# Patient Record
Sex: Female | Born: 1935 | Race: White | Hispanic: No
Health system: Southern US, Community
[De-identification: ages and names within clinical notes are randomized; demographics above are authoritative.]

---

## 2000-12-23 ENCOUNTER — Other Ambulatory Visit: Admission: RE | Admit: 2000-12-23 | Discharge: 2000-12-23 | Payer: Self-pay | Admitting: Internal Medicine

## 2001-02-21 ENCOUNTER — Emergency Department (HOSPITAL_COMMUNITY): Admission: EM | Admit: 2001-02-21 | Discharge: 2001-02-21 | Payer: Self-pay | Admitting: Emergency Medicine

## 2001-02-23 ENCOUNTER — Encounter: Payer: Self-pay | Admitting: Internal Medicine

## 2001-02-23 ENCOUNTER — Encounter: Admission: RE | Admit: 2001-02-23 | Discharge: 2001-02-23 | Payer: Self-pay | Admitting: Internal Medicine

## 2001-03-15 ENCOUNTER — Encounter: Admission: RE | Admit: 2001-03-15 | Discharge: 2001-03-15 | Payer: Self-pay | Admitting: Neurosurgery

## 2001-03-15 ENCOUNTER — Encounter: Payer: Self-pay | Admitting: Neurosurgery

## 2001-04-04 ENCOUNTER — Encounter: Payer: Self-pay | Admitting: Neurosurgery

## 2001-04-04 ENCOUNTER — Encounter: Admission: RE | Admit: 2001-04-04 | Discharge: 2001-04-04 | Payer: Self-pay | Admitting: Neurosurgery

## 2001-04-18 ENCOUNTER — Encounter: Payer: Self-pay | Admitting: Neurosurgery

## 2001-04-18 ENCOUNTER — Encounter: Admission: RE | Admit: 2001-04-18 | Discharge: 2001-04-18 | Payer: Self-pay | Admitting: Neurosurgery

## 2002-08-29 ENCOUNTER — Other Ambulatory Visit: Admission: RE | Admit: 2002-08-29 | Discharge: 2002-08-29 | Payer: Self-pay | Admitting: Internal Medicine

## 2006-10-19 ENCOUNTER — Ambulatory Visit: Payer: Self-pay | Admitting: Vascular Surgery

## 2007-03-11 ENCOUNTER — Encounter: Admission: RE | Admit: 2007-03-11 | Discharge: 2007-03-11 | Payer: Self-pay | Admitting: Internal Medicine

## 2007-06-23 ENCOUNTER — Encounter: Admission: RE | Admit: 2007-06-23 | Discharge: 2007-06-23 | Payer: Self-pay | Admitting: Internal Medicine

## 2007-09-29 ENCOUNTER — Encounter: Admission: RE | Admit: 2007-09-29 | Discharge: 2007-09-29 | Payer: Self-pay | Admitting: Internal Medicine

## 2007-10-26 ENCOUNTER — Encounter: Admission: RE | Admit: 2007-10-26 | Discharge: 2007-10-26 | Payer: Self-pay | Admitting: Specialist

## 2007-11-14 ENCOUNTER — Encounter: Admission: RE | Admit: 2007-11-14 | Discharge: 2007-11-14 | Payer: Self-pay | Admitting: Specialist

## 2008-01-11 ENCOUNTER — Inpatient Hospital Stay (HOSPITAL_COMMUNITY): Admission: RE | Admit: 2008-01-11 | Discharge: 2008-01-14 | Payer: Self-pay | Admitting: Specialist

## 2008-04-27 ENCOUNTER — Encounter: Admission: RE | Admit: 2008-04-27 | Discharge: 2008-04-27 | Payer: Self-pay | Admitting: Internal Medicine

## 2009-07-24 ENCOUNTER — Encounter: Admission: RE | Admit: 2009-07-24 | Discharge: 2009-07-24 | Payer: Self-pay | Admitting: Specialist

## 2009-09-18 IMAGING — CR DG LUMBAR SPINE 2-3V
3 series · 3 of 3 positions shown · non-contrast
Comparison: MRI dated 09/29/2007

CLINICAL DATA: Preop examination for lumbar stenosis.

LUMBAR SPINE - 2-3 VIEW

[t l-spine a.p.]
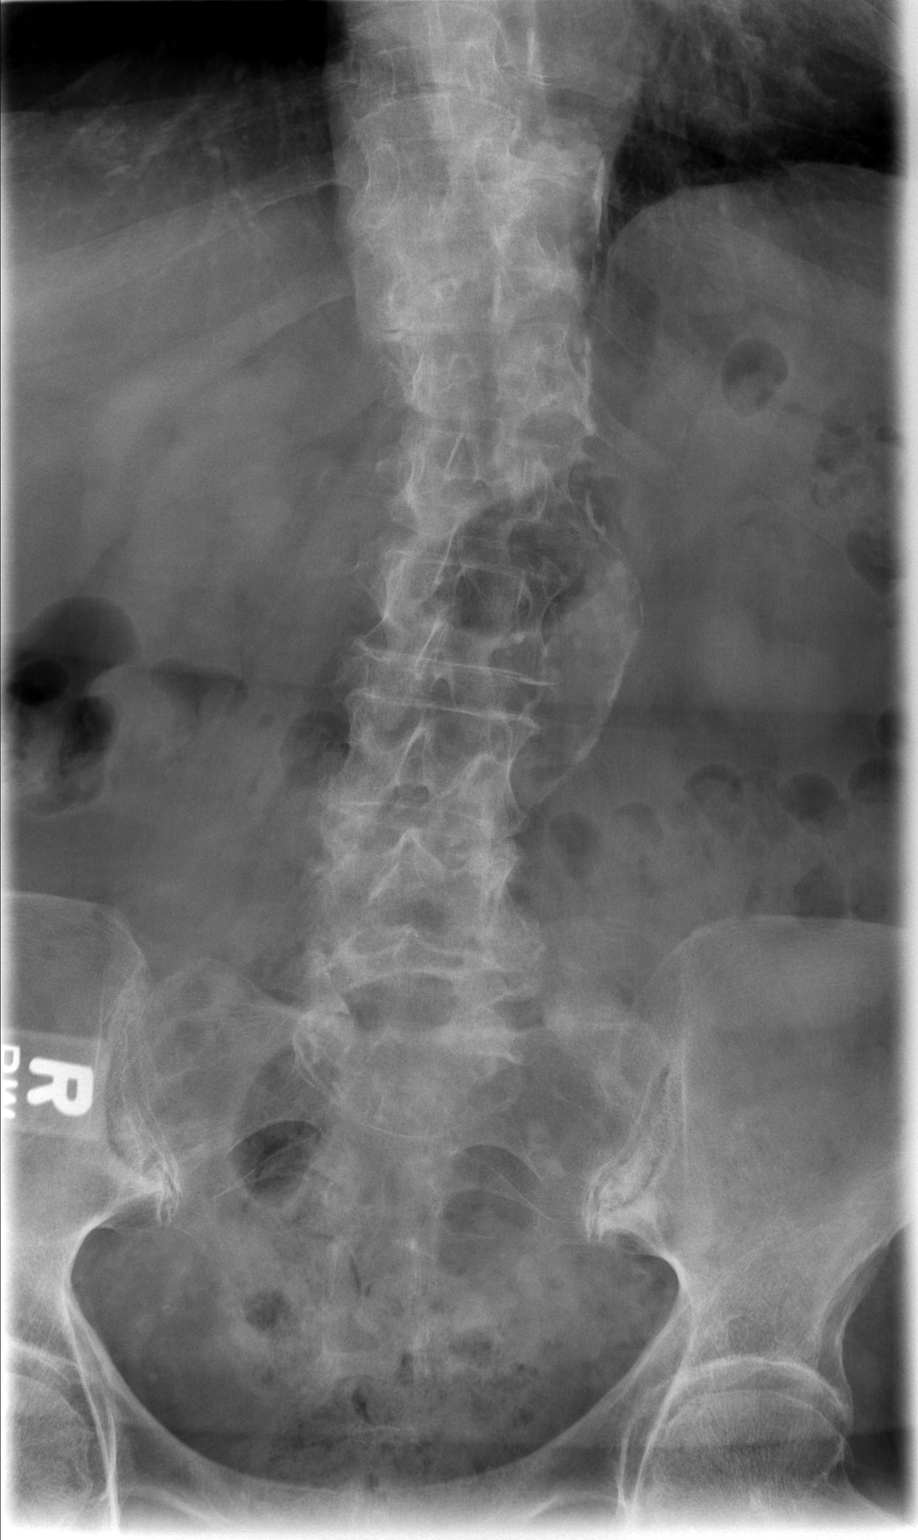

[t l-spine lat]
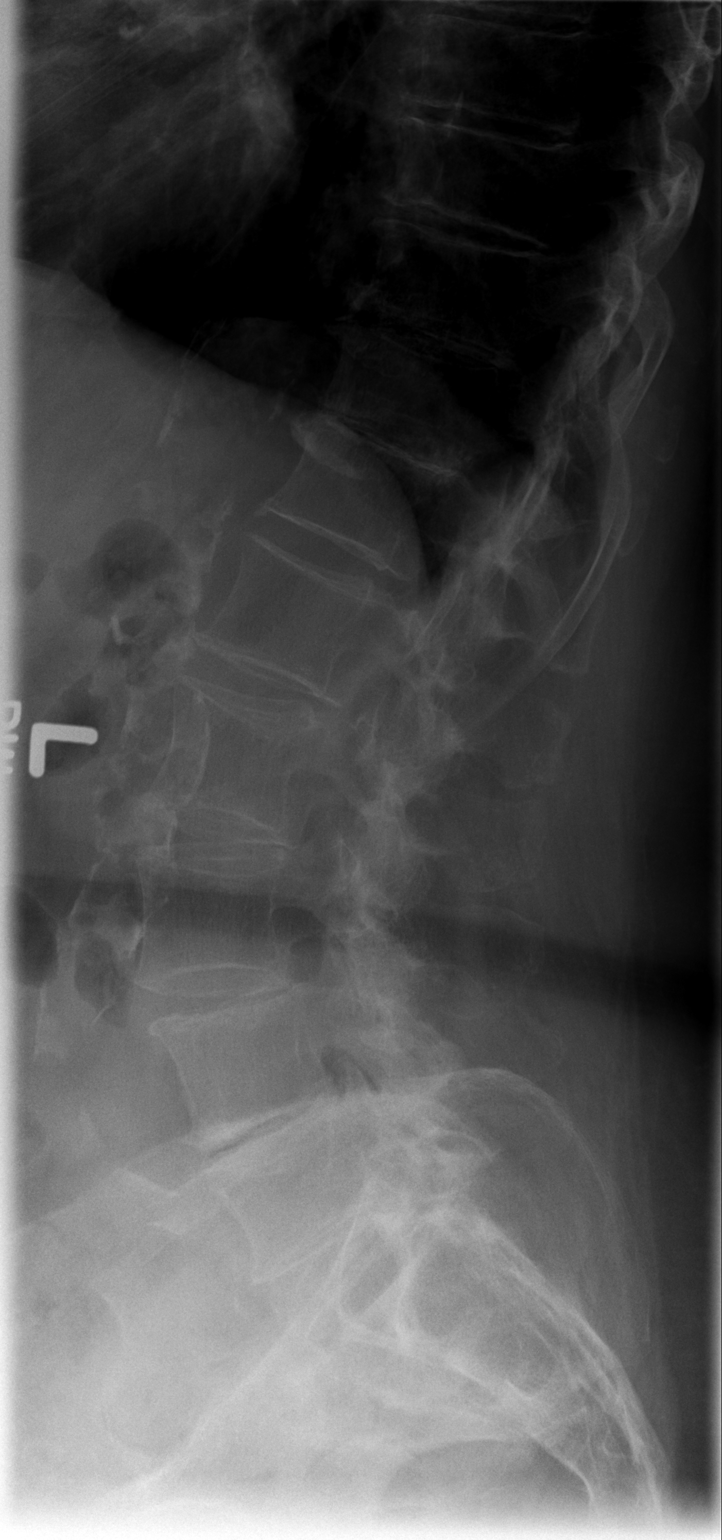

[t l-spine l5-s1 spot]
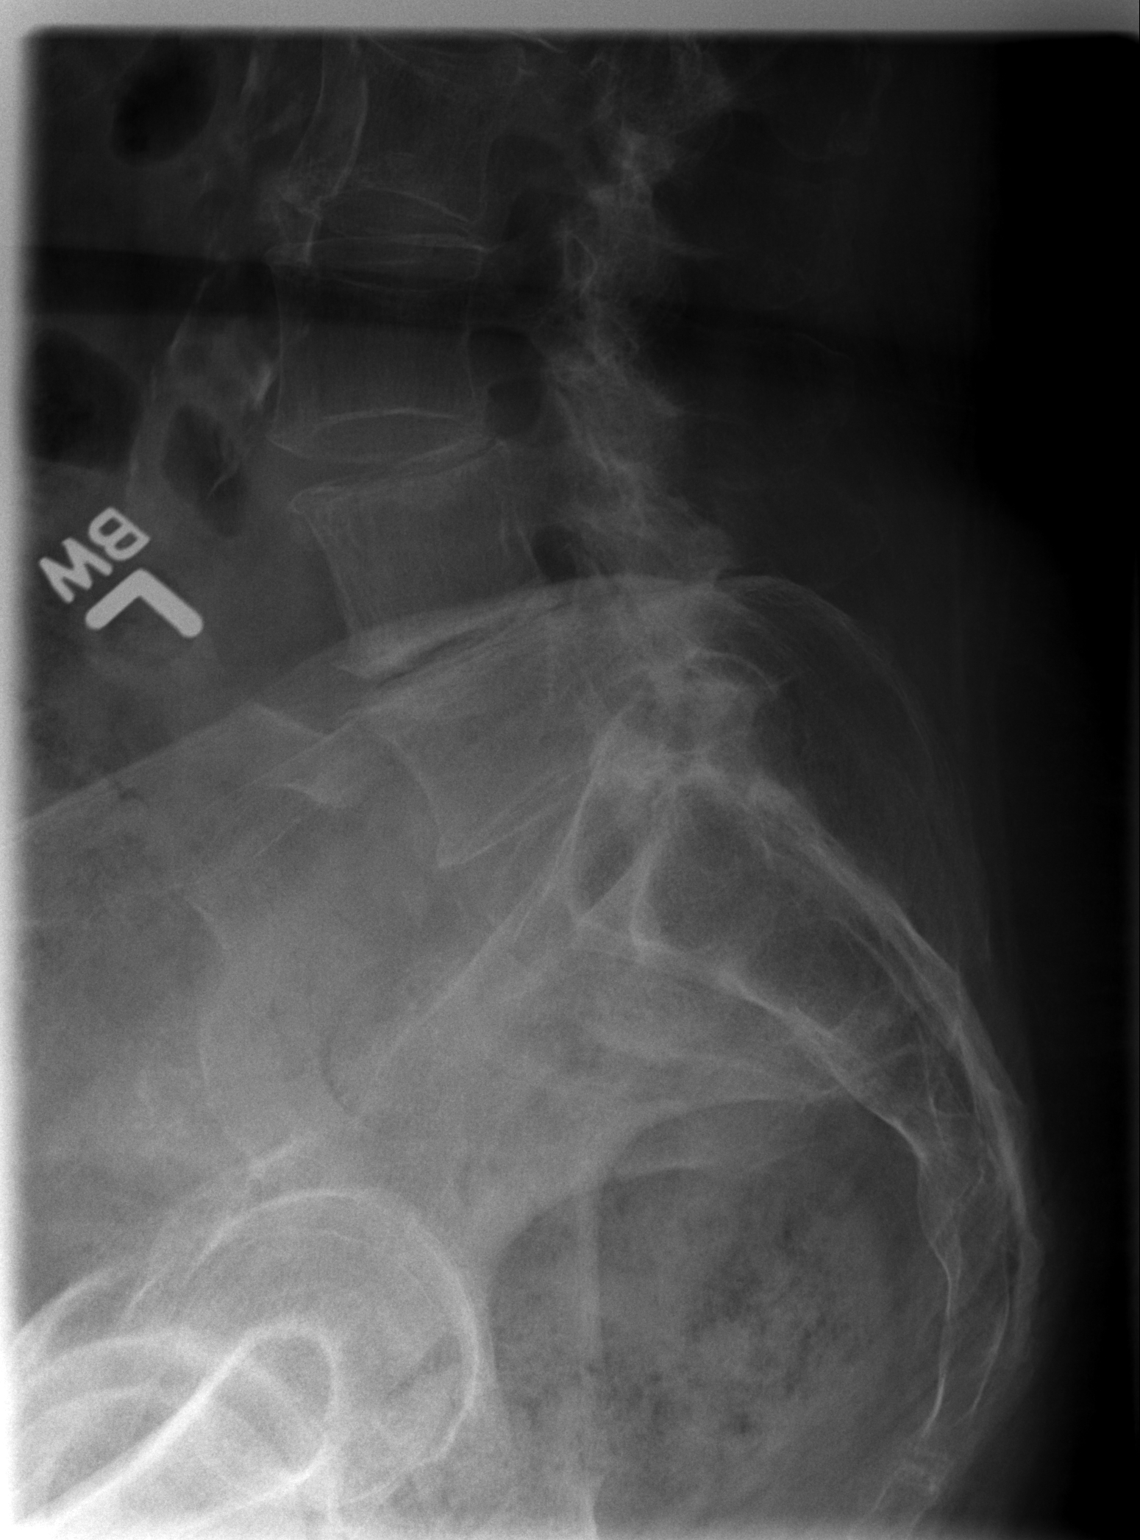

[3 of 3 positions shown; findings below may reference images not displayed]

FINDINGS: Five non-rib bearing lumbar type vertebra are present
with stable 3 mm anterolisthesis of L3 on L4.
Mild to moderate multilevel degenerative disc disease and facet
arthropathy noted throughout the lumbar spine with severe
degenerative disc disease and spondylosis at L4-L5.
Diffuse osteopenia is noted.
Aortic atherosclerotic calcifications are identified.
A mild apex left thoracolumbar scoliosis is present.
IMPRESSION: No evidence of acute abnormality.

Stable multilevel degenerative disc disease, spondylosis and facet
arthropathy, greatest at L4-L5.

Stable grade 1 anterolisthesis of L3 on L4 and diffuse osteopenia.

## 2009-09-20 IMAGING — CR DG OR PORTABLE SPINE
1 series · 1 of 1 positions shown · non-contrast
Comparison: Intraoperative views done earlier the same date and
preoperative radiographs 01/09/2008.

CLINICAL DATA: L3-L4 spinal stenosis.

PORTABLE SPINE

[view not recorded]
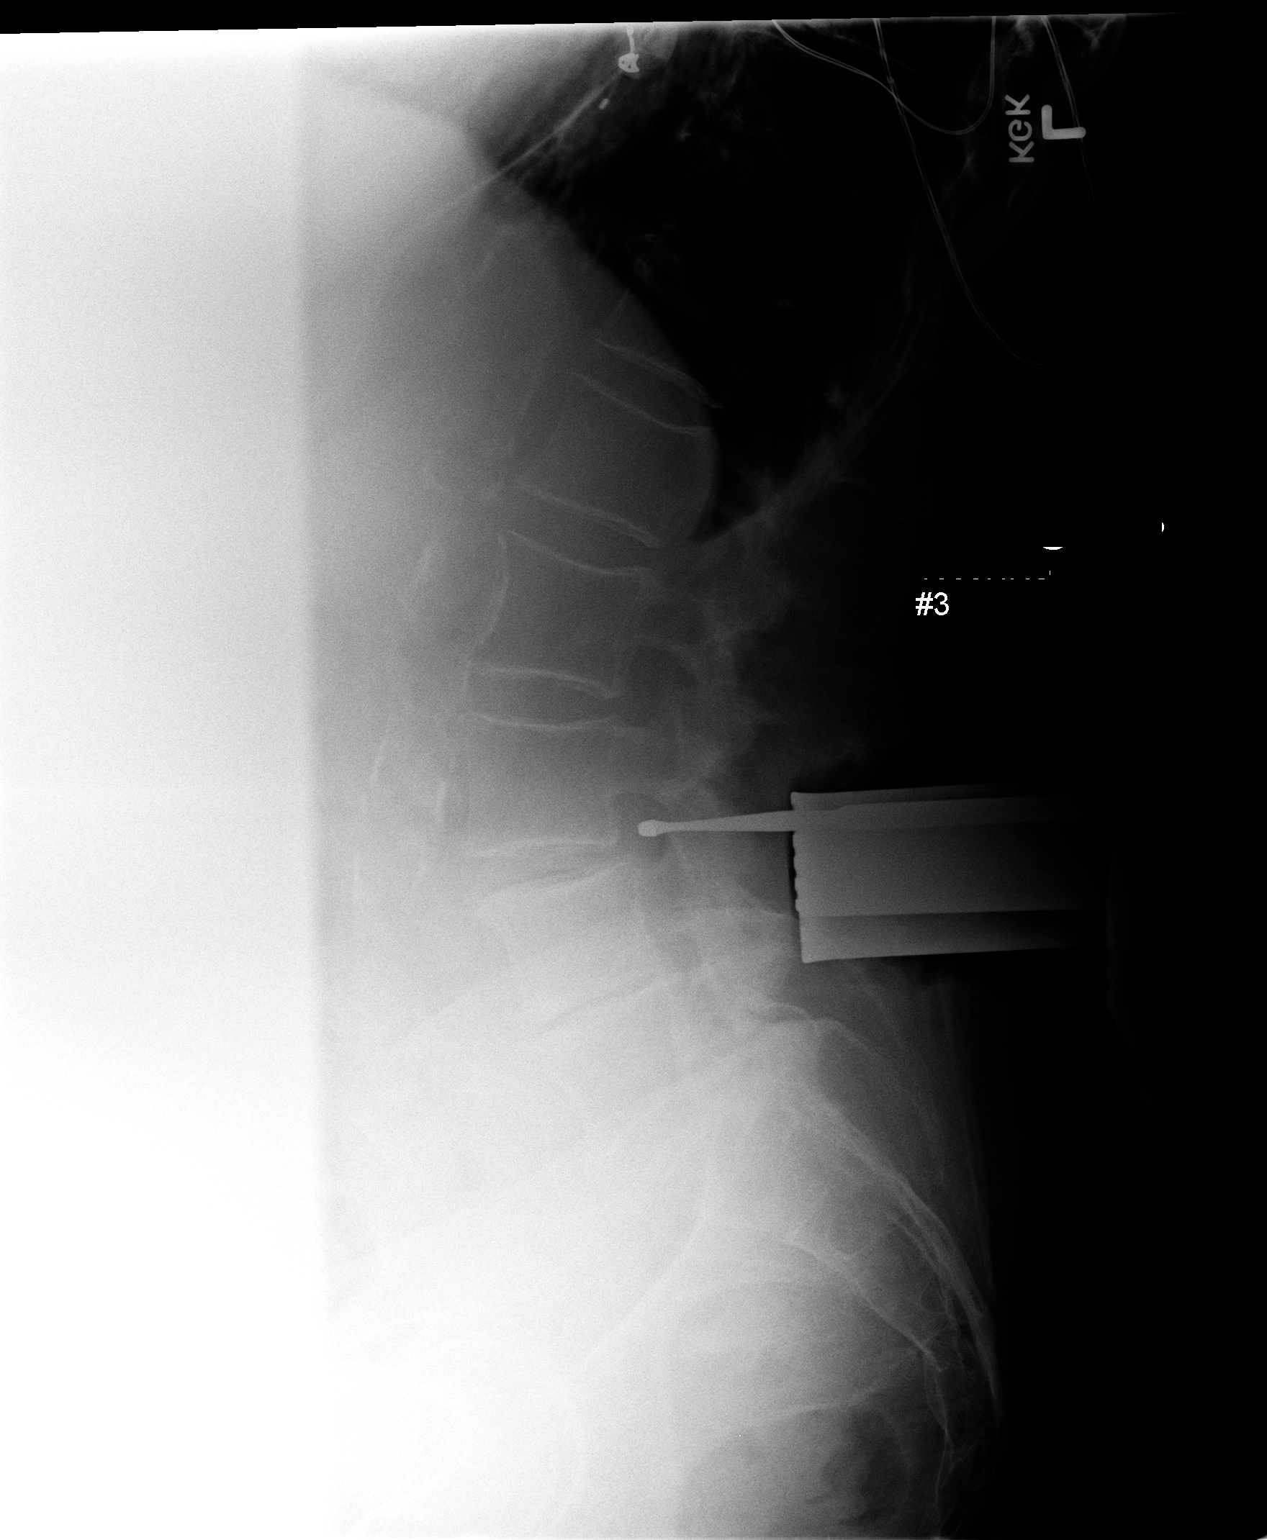

[1 of 1 positions shown; findings below may reference images not displayed]

FINDINGS: Film #3 at 4428 hours demonstrates skin spreaders
posterior to the L4 vertebral body.  There is a blunt surgical
instrument directed towards the posterior aspect of the L3-L4 disc
space.
IMPRESSION: L3-L4 localization.

## 2010-04-17 ENCOUNTER — Other Ambulatory Visit: Payer: Self-pay | Admitting: Internal Medicine

## 2010-04-17 DIAGNOSIS — R05 Cough: Secondary | ICD-10-CM

## 2010-04-21 ENCOUNTER — Ambulatory Visit
Admission: RE | Admit: 2010-04-21 | Discharge: 2010-04-21 | Disposition: A | Discharge status: Home / Self Care | Payer: Medicare Other | Source: Ambulatory Visit | Attending: Internal Medicine | Admitting: Internal Medicine

## 2010-04-21 DIAGNOSIS — R05 Cough: Secondary | ICD-10-CM

## 2010-04-23 ENCOUNTER — Ambulatory Visit (HOSPITAL_COMMUNITY)
Admission: RE | Admit: 2010-04-23 | Discharge: 2010-04-23 | Disposition: A | Discharge status: Home / Self Care | Payer: Medicare Other | Source: Ambulatory Visit | Attending: Internal Medicine | Admitting: Internal Medicine

## 2010-04-23 DIAGNOSIS — R0609 Other forms of dyspnea: Secondary | ICD-10-CM | POA: Insufficient documentation

## 2010-04-23 DIAGNOSIS — R0989 Other specified symptoms and signs involving the circulatory and respiratory systems: Secondary | ICD-10-CM | POA: Insufficient documentation

## 2010-06-24 NOTE — Procedures (Signed)
LOWER EXTREMITY ARTERIAL EVALUATION-EXERCISE WORKSHEET   INDICATION:  Bilateral claudication.  Patient complains of bilateral  lower extremity leg tiredness with exercise.   HISTORY:  Diabetes:  Orally controlled  Cardiac:  No  Hypertension:  Yes  Smoking:  No  Previous Surgery:  No   RESTING SYSTOLIC PRESSURES: (ABI)                                RIGHT       LEFT  Brachial:                     156         149  Thigh:  Anterior tibial at ankle:     158         158  Posterior tibial at ankle:    164 (>1.0)  116 (>1.0)  Peroneal at ankle:  DOPPLER WAVEFORM ANALYSIS:  Common femoral:  Popliteal:  Anterior tibial:              Triphasic   Triphasic  Posterior tibial:             Triphasic   Triphasic  Peroneal:   EXERCISE  Walking Time:  Five minutes.  Speed:  Incline:  Ambulated In Hallway:  Yes   SYMPTOMS:  Right hip pain after five minutes of exercise.  IMPRESSION:  1.  Resting ankle brachial indices and waveform suggest no  significant arterial occlusion.  2.  Ankle pressures were stable following exercise, suggestive of no  arterial occlusive disease bilaterally.   POST-EXERCISE  RIGHT                      LEFT  164        immediately     158             2 min.             4 min.             6 min.             8 min.             10 min.   ___________________________________________  Quita Skye. Hart [place], M.D.   MC/MEDQ  D:  10/19/2006  T:  10/20/2006  Job:  [id number]

## 2010-06-24 NOTE — Consult Note (Signed)
VASCULAR SURGERY CONSULTATION   SANTIAGO, GRAF  DOB:  1925-11-25                                       10/19/2006  ZOXWR#:[id number]   REFERRING PHYSICIAN:  Massie Maroon, MD   HISTORY OF PRESENT ILLNESS:  Ms. Weesner is a 77 year old female referred  by Dr. Selena Batten with a history of weakness in both anterior thighs with  ambulation.  She states that each day she walks about 1 mile but after  1/2 mile she gets heaviness in both thighs in the anterior aspect, the  right equal to the left.  She does not develop numbness, calf discomfort  or pain below the knees, and eventually this resolves when she stops  walking (5 minutes).  She has no history of rest pain or nonhealing  ulcers.   PAST MEDICAL HISTORY:  1. Diabetes mellitus, noninsulin dependent.  2. Hypertension.  3. Negative coronary artery disease, COPD, stroke or hyperlipidemia.   PAST SURGICAL HISTORY:  1. Appendectomy.  2. D&C.   FAMILY HISTORY:  Positive for coronary artery disease in her mother.  Negative for diabetes and stroke.   SOCIAL HISTORY:  She is widowed, has 4 children, and is retired.  She  does not use tobacco or alcohol.   REVIEW OF SYSTEMS:  Please see health history form.   MEDICATIONS:  Please see health history form.   ALLERGIES:  CODEINE CAUSES NAUSEA.   PHYSICAL EXAMINATION:  VITAL SIGNS:  Blood pressure is 140/70, heart  rate 70, respirations 18.  GENERAL:  She is a healthy-appearing, elderly female in no apparent  distress, alert and oriented times 3.  NECK:  Supple, 3+ carotid pulses palpable.  No bruits are audible.  There is no palpable adenopathy in the neck.  NEUROLOGIC:  Exam is normal.  CHEST:  Clear to auscultation.  CARDIOVASCULAR:  Exam reveals a regular rhythm with no murmurs.  ABDOMEN:  Soft, nontender, with no palpable masses.  She has 3+ femoral,  popliteal, and 2+ posterior tibial pulses bilaterally, with well-  perfused lower extremities.  There is no distal  edema.   ASSESSMENT:  Lower extremity arterial Dopplers were performed and  pressures were normal at rest and after 5 minutes of exercise.  Her  study was totally normal, with no evidence of any arterial insufficiency  to account for her symptoms.  I have reassured her regarding this, and  she does not need any further vascular evaluation.  I encouraged her to  continue to exercise actively.   Quita Skye Hart [place], M.D.  Electronically Signed  JDL/MEDQ  D:  10/19/2006  T:  10/20/2006  Job:  [id number]   cc:   Massie Maroon, MD

## 2010-06-24 NOTE — H&P (Signed)
Suzanne Thomas, BACOT NO.:  [id number]   MEDICAL RECORD NO.:  [id number]          PATIENT TYPE:  INP   LOCATION:                               FACILITY:  [facility]   PHYSICIAN:  Jene Every, M.D.    DATE OF BIRTH:  12/18/1925   DATE OF ADMISSION:  01/11/2008  DATE OF DISCHARGE:                              HISTORY & PHYSICAL   CHIEF COMPLAINT:  Left lower extremity pain.   HISTORY:  The patient is a pleasant 77 year old female who first  presented to our office in September of this year as a referral from her  primary care physician with symptoms of neurogenic claudication.  She  initially underwent conservative treatment including epidural steroid  series, but only had short-term relief from this.  She describes her  pain pattern as being disabling.  MRI studies do show severe stenosis at  3-4.  It is felt at this time she would benefit from a lumbar  decompression.  The risks and benefits of this were discussed with the  patient.  Medical clearance was obtained.  The patient was scheduled.   MEDICAL HISTORY:  Significant for:   1. Hypertension.  2. Hypercholesteremia.  3. Non-insulin-dependent diabetes.   CURRENT MEDICATIONS:  1. Actos 15 mg 1 p.o. b.i.d.  2. HCTZ 12.5 mg 1 p.o. every other day.  3. Lipitor 40 mg 1 p.o. q.h.s.  4. Byetta 5 mcg 1 p.o. b.i.d.  5. Azor 10/40 one p.o. daily.  6. Tekturna 300 mg 1 p.o. daily.  7. Aspirin 81 mg daily.  8. Ibuprofen 2 tablets t.i.d.   ALLERGIES:  The patient does have sensitivity to most PAIN MEDICATIONS.  CODEINE causes nausea.  __________ .  However, the patient is unaware  __________ to this.   PREVIOUS SURGERIES:  1. Tubal dictation.  2. Appendectomy.   SOCIAL HISTORY:  The patient is widowed.  She is retired.  She has no  history of tobacco or alcohol consumption.   PRIMARY CARE PHYSICIAN:  Dr. Pearson Grippe.   FAMILY HISTORY:  Her mother deceased of renal disease.  Father of  coronary artery  disease.   REVIEW OF SYSTEMS:  GENERAL:  The patient denies any fever, chills,  night sweats or bleeding tendencies.  CNS:  No blurred double vision, seizure, headache or paralysis.  RESPIRATORY:  No shortness of breath, productive cough or hemoptysis.  CARDIOVASCULAR:  No chest pain, angina or orthopnea.  GU:  No dysuria, hematuria or discharge.  GI:  No nausea, vomiting, diarrhea, constipation, melena.  MUSCULOSKELETAL:  As pertinent in HPI.   PHYSICAL EXAMINATION:  Weight is 202, BP is 140/78, respiratory 10,  pulse is 76.  GENERAL:  This is an overweight female who does walk with an antalgic  gait.  HEENT:  Atraumatic, normocephalic.  Pupils equal, round, and  reactive to light.  EOMs intact.  NECK:  Supple.  No lymphadenopathy.  CHEST:  Clear to auscultation bilaterally.  No rhonchi, wheezes or  rales.  BREASTS/GU:  Not examined and not pertinent to the HPI.  HEART:  Regular rate and  rhythm without murmurs, gallops or rubs.  ABDOMEN:  Protuberant, soft, nontender.  Bowel sounds x4.  SKIN:  No rashes or lesions are noted.  In regard to the back the patient does note some __________ flexion and  extension.  Motor is 5/5.  She is normoflexive.  Neurovascular function  is intact bilaterally.  She does have positive straight leg raise on the  left that produces  some buttock and low back pain.   IMPRESSION:  Severe stenosis at 3-4.   PLAN:  The patient admitted to [facility] to undergo a lumbar  decompression at 3-4, possible decompression 2-3 and 4-5 with lateral  mass fusion at 3-4.      Roma Schanz, P.A.      Jene Every, M.D.  Electronically Signed    CS/MEDQ  D:  01/03/2008  T:  01/03/2008  Job:  [id number]

## 2010-06-24 NOTE — Op Note (Signed)
Suzanne Thomas, Suzanne Thomas NO.:  [id number]   MEDICAL RECORD NO.:  [id number]          PATIENT TYPE:  INP   LOCATION:  0003                         FACILITY:  [facility]   PHYSICIAN:  Jene Every, M.D.    DATE OF BIRTH:  1925/10/11   DATE OF PROCEDURE:  01/11/2008  DATE OF DISCHARGE:                               OPERATIVE REPORT   PREOPERATIVE DIAGNOSIS:  Spinal stenosis at L3-4.   POSTOPERATIVE DIAGNOSIS:  Spinal stenosis at L3-4, stenosis at L2-3.   PROCEDURE:  Lumbar decompression L3-4, L2-3, central laminectomy of L3,  bilateral hemilaminotomy, lateral recess decompression at 3-4,  foraminotomy of 4 and 3 with microdiskectomy 3-4 left.   ANESTHESIA:  General.   SURGEON:  Jene Every, M.D.   ASSISTANT:  Roma Schanz, P.A.   BRIEF HISTORY:  An 77 year old with left lower extremity radiculopathy  and stenosis laterally at 3-4 and neural foraminal narrowing secondary  to disk herniation, neural foraminal stenosis, facet hypertrophy,  indication for decompression of the L4 and L3 nerve root, possible  fusion due to listhesis.  Risks and benefits were discussed including  bleeding, infection, damage to neurovascular structures, CSF leakage,  epidural fibrosis, anterior segment disease, need for fusion in the  future, anesthetic complication, etc.   PROCEDURE IN DETAIL:  With the patient in supine position after an  adequate level of general anesthesia and 2 grams of Kefzol and Foley she  was placed prone on the Hamilton frame.  All bony prominences were well-  padded.  Lumbar region was prepped and draped in the usual sterile  fashion.  Two 18 gauge spinal needles were utilized to localize the 3-4  interspace confirmed with x-ray.  Incision was made from above the  spinous process of 3 to below the spinous process of 4.  Subcutaneous  tissue was dissected.  Electrocautery was utilized to achieve  hemostasis.  Dorsolumbar fascia identified and divided in line  with skin  incision.  Paraspinous muscle elevated from the lamina of 2, 3 and 4.  McCullough retractor was placed.  Confirmatory radiographs obtained.  The spinous processes of 3, partial 4 were removed with the Leksell  rongeurs.  We saved the bone for possible fusion.  The ligamentum flavum  hypertrophy and a small interlaminar window was noted at 3-4.  We used a  2 mm Kerrison to perform a central decompression and remove the central  lamina of 3.  Severe lateral recess stenosis.  With the use of the  operating microscope which was draped and brought into the surgical  field to decompressed the lateral recess to the medial border of the  pedicle.  Performed a foraminotomy of 4 bilaterally.  She had fairly  thin dura where the epidural remnants were noted.  She had extensive  epidural venous plexus.  She had fair amount of persistent bleeding  throughout the case.  We used bone wax, thrombin soaked Gelfoam and  FloSeal to achieve hemostasis.  There was significant compression into  the lateral recess of the L4 and L3 nerve root.  There was a vascular  plexus  and bipolar cautery was utilized to achieve hemostasis.  This was  divided and mobilized with 3 and 4 roots, HNP was noted extending out  into the foramen.  Annulotomy was performed.  A copious portion of disk  material was removed from the disk in the subannular space and out into  the foramen, further mobilized with a nerve hook.  Foraminotomy of 3 was  performed with undercut of the facet.  Partial medial facetectomy of the  facet was performed previously utilizing an osteotome and a 2 mm  Kerrison.  Although there was severe facet hypertrophy there was not  much motion at the site.  Given her fair amount of bleeding through the  case we felt it was not prudent to proceed with the fusion portion of  that.  The wound was copiously irrigated.  We placed the hockey stick  out the foramen of 3 and it was confirmed by x-ray.  We were  then able  to place the hockey stick probe up to the pedicle of 2 down to the  pedicle of 4 and widely patent.  The foramen of 3 and 4 were widely  patent bilaterally.  No CSF leakage or active bleeding noted.  We  achieved hemostasis with both thrombin soaked Gelfoam and FloSeal and  bone wax.  The wound was then copiously irrigated with antibiotic  irrigation as was the disk space.  No active bleeding or CSF leakage was  noted.  Removed the [facility] retractor.  Paraspinous muscle inspected  and with no active bleeding.  Copiously irrigated the wound and repaired  the fascia with #1 Vicryl interrupted figure-of-eight sutures.  There  was a small area distally that was left with a slight aperture for a  drain if needed.  Subcu was reapproximated with 2-0 Vicryl simple  sutures.  Skin was reapproximated with staples and the wound was dressed  sterilely.  She was placed supine on the hospital bed, extubated without  difficulty and transported to recovery room in satisfactory condition.  The patient tolerated the procedure with no complications.  Blood loss  250 mL.      Jene Every, M.D.  Electronically Signed     JB/MEDQ  D:  01/11/2008  T:  01/11/2008  Job:  [id number]

## 2010-06-27 NOTE — Discharge Summary (Signed)
NAMEELIANAH, Suzanne NO.:  [id number]   MEDICAL RECORD NO.:  [id number]          PATIENT TYPE:  INP   LOCATION:  1537                         FACILITY:  [facility]   PHYSICIAN:  Jene Every, M.D.    DATE OF BIRTH:  09-10-1925   DATE OF ADMISSION:  01/11/2008  DATE OF DISCHARGE:  01/14/2008                               DISCHARGE SUMMARY   ADMISSION DIAGNOSES:  1. Severe spinal stenosis at L3-4.  2. Hypertension.  3. Hypercholesterolemia.  4. Non-insulin dependent diabetes.   DISCHARGE DIAGNOSES:  1. Status post lumbar decompression at 2-3, 3-4.  2. Hypertension.  3. Hypercholesterolemia.  4. Non-insulin dependent diabetes.   HISTORY:  Ms. Merida is a pleasant 77 year old female with a longstanding  history of back and lower extremity pain that has progressively gotten  worse.  She has undergone conservative treatment and failed.  She does  note her pain pattern is disabling and it is felt  at this point she  would benefit from a decompression.  Risks and benefits were discussed  and the patient does elect to proceed.   PROCEDURE:  The patient was taken to the OR and underwent central  decompression at 2-3. 3-4.  Surgeon:  Dr. Jene Every.  Assistant:  Roma Schanz, PA.  Anesthesia:  General.  Complications:  None.   CONSULTANT:  PT,  OT, Care Management.   LABORATORY:  Admission CBC showed a white cell count 5.7, hemoglobin  1.1, hematocrit 33.1.  This was followed during the hospital course.  The patient's hemoglobin did drop to 8.8, hematocrit 25.5.  However, it  improved to 9.6 and 29.7 at time of discharge.  Coagulation studies done  preoperatively within normal range.  Preoperative chemistries showed a  sodium 137, potassium 4.2, glucose of 165, BUN of 25, creatinine 1.37.  Postoperative repeat of this showed normal sodium, potassium, glucose of  153 and BUN of 20, creatinine 1.25.  The patient had decreased renal  function with a GFR of 41.   Routine liver function tests within normal  range.  Preoperative urinalysis showed small amount of leukocyte  esterase, 3-66 WBCs noted per high power field.  Blood type is A+.  Preoperative EKG showed normal sinus rhythm.  Preoperative chest x-ray  showed no active disease.   HOSPITAL COURSE:  The patient was admitted, taken to the OR and  underwent the above stated procedure without difficulty.  Postop day #1  the patient did have some nausea as well as some vomiting that did seem  to resolve with Zofran.  She denied any chest pain, shortness of breath.  She did not note any headache.  Question if nausea is related to the  pain medication.  She is very sensitive.  Hemoglobin did drop to 8.8,  hematocrit 25.5.  The patient was started on iron supplementation.  Zofran was continued per p.r.n. nausea.  Incentive spirometer was  encouraged.  We did discontinue the folate.  Once p.o. intake was  improved, we KVO her IV.  On postop day #2 the patient continued to  improve but she still was  having some issues with nausea.  She had been  out of bed.  She was voiding without difficulty.  Vital signs were  stable.  Hemoglobin improved to 9.6, hematocrit to 29.7.  We did at this  time discontinue all narcotic pain medications.  The patient got Tylenol  for pain relief.  PT OT was continued.  Discharge planning was  initiated.  On postop day #3 the patient was doing very well.  She was  stable.  No nausea.  Pain was controlled with just Tylenol.  It was felt  she was stable to be discharged home with home health PT and OT.   DISPOSITION:  The patient discharged home.   FOLLOWUP:  With Dr. Shelle Iron in approximately 10-14 days for suture removal  and x-ray.   WOUND CARE:  She is to keep this area clean and dry.  Change daily.   ACTIVITY:  She is to walk as tolerated utilizing a walker.  No bending,  stooping, twisting or lifting.   DIET:  Is as tolerated.   DISCHARGE MEDICATIONS:  Include home  medications as well as:  1. Vitamin C 500 mg daily.  2. Tylenol or Advil as needed for pain relief.   CONDITION ON DISCHARGE:  Stable.   FINAL DIAGNOSIS:  Doing well status post central decompression at 2-3  and 3-4.      Roma Schanz, P.A.      Jene Every, M.D.  Electronically Signed    CS/MEDQ  D:  02/01/2008  T:  02/01/2008  Job:  [id number]

## 2010-11-11 LAB — COMPREHENSIVE METABOLIC PANEL
AST: 23 U/L (ref 0–37)
CO2: 23 mEq/L (ref 19–32)
Calcium: 9.2 mg/dL (ref 8.4–10.5)
Creatinine, Ser: 1.37 mg/dL — ABNORMAL HIGH (ref 0.4–1.2)
GFR calc Af Amer: 45 mL/min — ABNORMAL LOW (ref >60)
GFR calc non Af Amer: 37 mL/min — ABNORMAL LOW (ref >60)
Total Protein: 6.8 g/dL (ref 6.0–8.3)

## 2010-11-11 LAB — CBC
HCT: 33.1 % — ABNORMAL LOW (ref 36.0–46.0)
Hemoglobin: 11.1 g/dL — ABNORMAL LOW (ref 12.0–15.0)
MCV: 96.3 fL (ref 78.0–100.0)
RDW: 14.3 % (ref 11.5–15.5)

## 2010-11-11 LAB — URINALYSIS, ROUTINE W REFLEX MICROSCOPIC
Glucose, UA: NEGATIVE mg/dL
Protein, ur: NEGATIVE mg/dL
Specific Gravity, Urine: 1.011 (ref 1.005–1.030)
pH: 6 (ref 5.0–8.0)

## 2010-11-11 LAB — APTT: aPTT: 21 seconds — ABNORMAL LOW (ref 24–37)

## 2010-11-11 LAB — URINE MICROSCOPIC-ADD ON

## 2010-11-14 LAB — GLUCOSE, CAPILLARY
Glucose-Capillary: 129 mg/dL — ABNORMAL HIGH (ref 70–99)
Glucose-Capillary: 142 mg/dL — ABNORMAL HIGH (ref 70–99)
Glucose-Capillary: 143 mg/dL — ABNORMAL HIGH (ref 70–99)
Glucose-Capillary: 145 mg/dL — ABNORMAL HIGH (ref 70–99)
Glucose-Capillary: 161 mg/dL — ABNORMAL HIGH (ref 70–99)
Glucose-Capillary: 174 mg/dL — ABNORMAL HIGH (ref 70–99)
Glucose-Capillary: 175 mg/dL — ABNORMAL HIGH (ref 70–99)

## 2010-11-14 LAB — HEMOGLOBIN AND HEMATOCRIT, BLOOD: HCT: 29.7 % — ABNORMAL LOW (ref 36.0–46.0)

## 2010-11-14 LAB — CBC
Platelets: 234 10*3/uL (ref 150–400)
WBC: 9.3 10*3/uL (ref 4.0–10.5)

## 2010-11-14 LAB — BASIC METABOLIC PANEL
BUN: 20 mg/dL (ref 6–23)
Creatinine, Ser: 1.25 mg/dL — ABNORMAL HIGH (ref 0.4–1.2)
GFR calc non Af Amer: 41 mL/min — ABNORMAL LOW (ref >60)

## 2010-11-14 LAB — TYPE AND SCREEN
ABO/RH(D): A POS
Antibody Screen: NEGATIVE

## 2011-04-06 DIAGNOSIS — H1045 Other chronic allergic conjunctivitis: Secondary | ICD-10-CM | POA: Diagnosis not present

## 2011-04-06 DIAGNOSIS — H40019 Open angle with borderline findings, low risk, unspecified eye: Secondary | ICD-10-CM | POA: Diagnosis not present

## 2011-04-06 DIAGNOSIS — H26499 Other secondary cataract, unspecified eye: Secondary | ICD-10-CM | POA: Diagnosis not present

## 2011-04-06 DIAGNOSIS — Z961 Presence of intraocular lens: Secondary | ICD-10-CM | POA: Diagnosis not present

## 2011-04-14 DIAGNOSIS — E119 Type 2 diabetes mellitus without complications: Secondary | ICD-10-CM | POA: Diagnosis not present

## 2011-04-17 DIAGNOSIS — E789 Disorder of lipoprotein metabolism, unspecified: Secondary | ICD-10-CM | POA: Diagnosis not present

## 2011-04-17 DIAGNOSIS — I10 Essential (primary) hypertension: Secondary | ICD-10-CM | POA: Diagnosis not present

## 2011-04-17 DIAGNOSIS — E119 Type 2 diabetes mellitus without complications: Secondary | ICD-10-CM | POA: Diagnosis not present

## 2011-05-22 DIAGNOSIS — R0989 Other specified symptoms and signs involving the circulatory and respiratory systems: Secondary | ICD-10-CM | POA: Diagnosis not present

## 2011-05-22 DIAGNOSIS — I10 Essential (primary) hypertension: Secondary | ICD-10-CM | POA: Diagnosis not present

## 2011-05-22 DIAGNOSIS — E7800 Pure hypercholesterolemia, unspecified: Secondary | ICD-10-CM | POA: Diagnosis not present

## 2011-05-22 DIAGNOSIS — R0609 Other forms of dyspnea: Secondary | ICD-10-CM | POA: Diagnosis not present

## 2011-05-22 DIAGNOSIS — E1169 Type 2 diabetes mellitus with other specified complication: Secondary | ICD-10-CM | POA: Diagnosis not present

## 2011-07-20 DIAGNOSIS — E119 Type 2 diabetes mellitus without complications: Secondary | ICD-10-CM | POA: Diagnosis not present

## 2011-07-20 DIAGNOSIS — I10 Essential (primary) hypertension: Secondary | ICD-10-CM | POA: Diagnosis not present

## 2011-07-20 DIAGNOSIS — E7800 Pure hypercholesterolemia, unspecified: Secondary | ICD-10-CM | POA: Diagnosis not present

## 2011-07-23 DIAGNOSIS — E1365 Other specified diabetes mellitus with hyperglycemia: Secondary | ICD-10-CM | POA: Diagnosis not present

## 2011-07-23 DIAGNOSIS — R609 Edema, unspecified: Secondary | ICD-10-CM | POA: Diagnosis not present

## 2011-07-23 DIAGNOSIS — E669 Obesity, unspecified: Secondary | ICD-10-CM | POA: Diagnosis not present

## 2011-07-23 DIAGNOSIS — E875 Hyperkalemia: Secondary | ICD-10-CM | POA: Diagnosis not present

## 2011-07-23 DIAGNOSIS — I10 Essential (primary) hypertension: Secondary | ICD-10-CM | POA: Diagnosis not present

## 2011-10-08 DIAGNOSIS — H43829 Vitreomacular adhesion, unspecified eye: Secondary | ICD-10-CM | POA: Diagnosis not present

## 2011-10-08 DIAGNOSIS — Z961 Presence of intraocular lens: Secondary | ICD-10-CM | POA: Diagnosis not present

## 2011-10-08 DIAGNOSIS — H04129 Dry eye syndrome of unspecified lacrimal gland: Secondary | ICD-10-CM | POA: Diagnosis not present

## 2011-10-08 DIAGNOSIS — H26499 Other secondary cataract, unspecified eye: Secondary | ICD-10-CM | POA: Diagnosis not present

## 2011-10-08 DIAGNOSIS — H40019 Open angle with borderline findings, low risk, unspecified eye: Secondary | ICD-10-CM | POA: Diagnosis not present

## 2011-11-10 DIAGNOSIS — E1365 Other specified diabetes mellitus with hyperglycemia: Secondary | ICD-10-CM | POA: Diagnosis not present

## 2011-11-10 DIAGNOSIS — I10 Essential (primary) hypertension: Secondary | ICD-10-CM | POA: Diagnosis not present

## 2011-11-13 DIAGNOSIS — Z23 Encounter for immunization: Secondary | ICD-10-CM | POA: Diagnosis not present

## 2011-11-13 DIAGNOSIS — E119 Type 2 diabetes mellitus without complications: Secondary | ICD-10-CM | POA: Diagnosis not present

## 2011-11-13 DIAGNOSIS — E669 Obesity, unspecified: Secondary | ICD-10-CM | POA: Diagnosis not present

## 2011-11-13 DIAGNOSIS — E7800 Pure hypercholesterolemia, unspecified: Secondary | ICD-10-CM | POA: Diagnosis not present

## 2011-11-13 DIAGNOSIS — I10 Essential (primary) hypertension: Secondary | ICD-10-CM | POA: Diagnosis not present

## 2012-02-26 DIAGNOSIS — I129 Hypertensive chronic kidney disease with stage 1 through stage 4 chronic kidney disease, or unspecified chronic kidney disease: Secondary | ICD-10-CM | POA: Diagnosis not present

## 2012-02-26 DIAGNOSIS — E119 Type 2 diabetes mellitus without complications: Secondary | ICD-10-CM | POA: Diagnosis not present

## 2012-03-11 ENCOUNTER — Other Ambulatory Visit: Payer: Self-pay | Admitting: Nephrology

## 2012-03-15 DIAGNOSIS — E119 Type 2 diabetes mellitus without complications: Secondary | ICD-10-CM | POA: Diagnosis not present

## 2012-03-15 DIAGNOSIS — M810 Age-related osteoporosis without current pathological fracture: Secondary | ICD-10-CM | POA: Diagnosis not present

## 2012-03-15 DIAGNOSIS — I10 Essential (primary) hypertension: Secondary | ICD-10-CM | POA: Diagnosis not present

## 2012-03-18 ENCOUNTER — Ambulatory Visit
Admission: RE | Admit: 2012-03-18 | Discharge: 2012-03-18 | Disposition: A | Discharge status: Home / Self Care | Payer: Medicare Other | Source: Ambulatory Visit | Attending: Nephrology | Admitting: Nephrology

## 2012-03-21 DIAGNOSIS — E119 Type 2 diabetes mellitus without complications: Secondary | ICD-10-CM | POA: Diagnosis not present

## 2012-03-21 DIAGNOSIS — Z0000 Encounter for general adult medical examination without abnormal findings: Secondary | ICD-10-CM | POA: Diagnosis not present

## 2012-03-21 DIAGNOSIS — I10 Essential (primary) hypertension: Secondary | ICD-10-CM | POA: Diagnosis not present

## 2012-03-21 DIAGNOSIS — E7800 Pure hypercholesterolemia, unspecified: Secondary | ICD-10-CM | POA: Diagnosis not present

## 2012-05-04 DIAGNOSIS — E119 Type 2 diabetes mellitus without complications: Secondary | ICD-10-CM | POA: Diagnosis not present

## 2012-05-04 DIAGNOSIS — I129 Hypertensive chronic kidney disease with stage 1 through stage 4 chronic kidney disease, or unspecified chronic kidney disease: Secondary | ICD-10-CM | POA: Diagnosis not present

## 2012-05-19 DIAGNOSIS — E7800 Pure hypercholesterolemia, unspecified: Secondary | ICD-10-CM | POA: Diagnosis not present

## 2012-05-19 DIAGNOSIS — R0609 Other forms of dyspnea: Secondary | ICD-10-CM | POA: Diagnosis not present

## 2012-05-19 DIAGNOSIS — I10 Essential (primary) hypertension: Secondary | ICD-10-CM | POA: Diagnosis not present

## 2012-05-19 DIAGNOSIS — R9439 Abnormal result of other cardiovascular function study: Secondary | ICD-10-CM | POA: Diagnosis not present

## 2012-06-15 DIAGNOSIS — E119 Type 2 diabetes mellitus without complications: Secondary | ICD-10-CM | POA: Diagnosis not present

## 2012-06-15 DIAGNOSIS — I10 Essential (primary) hypertension: Secondary | ICD-10-CM | POA: Diagnosis not present

## 2012-06-24 DIAGNOSIS — I10 Essential (primary) hypertension: Secondary | ICD-10-CM | POA: Diagnosis not present

## 2012-06-24 DIAGNOSIS — E7800 Pure hypercholesterolemia, unspecified: Secondary | ICD-10-CM | POA: Diagnosis not present

## 2012-06-24 DIAGNOSIS — E119 Type 2 diabetes mellitus without complications: Secondary | ICD-10-CM | POA: Diagnosis not present

## 2012-06-24 DIAGNOSIS — Z780 Asymptomatic menopausal state: Secondary | ICD-10-CM | POA: Diagnosis not present

## 2012-09-27 DIAGNOSIS — I10 Essential (primary) hypertension: Secondary | ICD-10-CM | POA: Diagnosis not present

## 2012-09-27 DIAGNOSIS — M810 Age-related osteoporosis without current pathological fracture: Secondary | ICD-10-CM | POA: Diagnosis not present

## 2012-09-27 DIAGNOSIS — E119 Type 2 diabetes mellitus without complications: Secondary | ICD-10-CM | POA: Diagnosis not present

## 2012-09-30 DIAGNOSIS — M25569 Pain in unspecified knee: Secondary | ICD-10-CM | POA: Diagnosis not present

## 2012-09-30 DIAGNOSIS — I10 Essential (primary) hypertension: Secondary | ICD-10-CM | POA: Diagnosis not present

## 2012-09-30 DIAGNOSIS — E7800 Pure hypercholesterolemia, unspecified: Secondary | ICD-10-CM | POA: Diagnosis not present

## 2012-09-30 DIAGNOSIS — E119 Type 2 diabetes mellitus without complications: Secondary | ICD-10-CM | POA: Diagnosis not present

## 2012-10-12 DIAGNOSIS — H35379 Puckering of macula, unspecified eye: Secondary | ICD-10-CM | POA: Diagnosis not present

## 2012-10-12 DIAGNOSIS — H02839 Dermatochalasis of unspecified eye, unspecified eyelid: Secondary | ICD-10-CM | POA: Diagnosis not present

## 2012-10-12 DIAGNOSIS — E119 Type 2 diabetes mellitus without complications: Secondary | ICD-10-CM | POA: Diagnosis not present

## 2012-10-12 DIAGNOSIS — H04129 Dry eye syndrome of unspecified lacrimal gland: Secondary | ICD-10-CM | POA: Diagnosis not present

## 2012-10-12 DIAGNOSIS — Z961 Presence of intraocular lens: Secondary | ICD-10-CM | POA: Diagnosis not present

## 2012-10-12 DIAGNOSIS — H26499 Other secondary cataract, unspecified eye: Secondary | ICD-10-CM | POA: Diagnosis not present

## 2012-10-12 DIAGNOSIS — H40019 Open angle with borderline findings, low risk, unspecified eye: Secondary | ICD-10-CM | POA: Diagnosis not present

## 2012-12-27 DIAGNOSIS — E119 Type 2 diabetes mellitus without complications: Secondary | ICD-10-CM | POA: Diagnosis not present

## 2012-12-27 DIAGNOSIS — I10 Essential (primary) hypertension: Secondary | ICD-10-CM | POA: Diagnosis not present

## 2012-12-30 DIAGNOSIS — E119 Type 2 diabetes mellitus without complications: Secondary | ICD-10-CM | POA: Diagnosis not present

## 2012-12-30 DIAGNOSIS — E7800 Pure hypercholesterolemia, unspecified: Secondary | ICD-10-CM | POA: Diagnosis not present

## 2012-12-30 DIAGNOSIS — Z23 Encounter for immunization: Secondary | ICD-10-CM | POA: Diagnosis not present

## 2012-12-30 DIAGNOSIS — M549 Dorsalgia, unspecified: Secondary | ICD-10-CM | POA: Diagnosis not present

## 2012-12-30 DIAGNOSIS — I10 Essential (primary) hypertension: Secondary | ICD-10-CM | POA: Diagnosis not present

## 2013-03-31 DIAGNOSIS — I10 Essential (primary) hypertension: Secondary | ICD-10-CM | POA: Diagnosis not present

## 2013-03-31 DIAGNOSIS — E119 Type 2 diabetes mellitus without complications: Secondary | ICD-10-CM | POA: Diagnosis not present

## 2013-03-31 DIAGNOSIS — E7800 Pure hypercholesterolemia, unspecified: Secondary | ICD-10-CM | POA: Diagnosis not present

## 2013-04-13 DIAGNOSIS — H04129 Dry eye syndrome of unspecified lacrimal gland: Secondary | ICD-10-CM | POA: Diagnosis not present

## 2013-04-13 DIAGNOSIS — H40019 Open angle with borderline findings, low risk, unspecified eye: Secondary | ICD-10-CM | POA: Diagnosis not present

## 2013-04-13 DIAGNOSIS — H02839 Dermatochalasis of unspecified eye, unspecified eyelid: Secondary | ICD-10-CM | POA: Diagnosis not present

## 2013-04-17 DIAGNOSIS — I10 Essential (primary) hypertension: Secondary | ICD-10-CM | POA: Diagnosis not present

## 2013-04-17 DIAGNOSIS — E7800 Pure hypercholesterolemia, unspecified: Secondary | ICD-10-CM | POA: Diagnosis not present

## 2013-04-17 DIAGNOSIS — E119 Type 2 diabetes mellitus without complications: Secondary | ICD-10-CM | POA: Diagnosis not present

## 2013-05-04 DIAGNOSIS — E119 Type 2 diabetes mellitus without complications: Secondary | ICD-10-CM | POA: Diagnosis not present

## 2013-05-04 DIAGNOSIS — N183 Chronic kidney disease, stage 3 unspecified: Secondary | ICD-10-CM | POA: Diagnosis not present

## 2013-05-04 DIAGNOSIS — I10 Essential (primary) hypertension: Secondary | ICD-10-CM | POA: Diagnosis not present

## 2013-07-18 DIAGNOSIS — E119 Type 2 diabetes mellitus without complications: Secondary | ICD-10-CM | POA: Diagnosis not present

## 2013-07-18 DIAGNOSIS — I10 Essential (primary) hypertension: Secondary | ICD-10-CM | POA: Diagnosis not present

## 2013-07-25 DIAGNOSIS — E7800 Pure hypercholesterolemia, unspecified: Secondary | ICD-10-CM | POA: Diagnosis not present

## 2013-07-25 DIAGNOSIS — E119 Type 2 diabetes mellitus without complications: Secondary | ICD-10-CM | POA: Diagnosis not present

## 2013-07-25 DIAGNOSIS — I10 Essential (primary) hypertension: Secondary | ICD-10-CM | POA: Diagnosis not present

## 2013-07-26 ENCOUNTER — Other Ambulatory Visit (HOSPITAL_COMMUNITY): Payer: Self-pay

## 2013-07-26 DIAGNOSIS — Z7689 Persons encountering health services in other specified circumstances: Secondary | ICD-10-CM

## 2013-07-28 ENCOUNTER — Encounter (HOSPITAL_COMMUNITY): Payer: Medicare Other

## 2013-08-10 DIAGNOSIS — E7800 Pure hypercholesterolemia, unspecified: Secondary | ICD-10-CM | POA: Diagnosis not present

## 2013-08-10 DIAGNOSIS — I10 Essential (primary) hypertension: Secondary | ICD-10-CM | POA: Diagnosis not present

## 2013-08-10 DIAGNOSIS — E119 Type 2 diabetes mellitus without complications: Secondary | ICD-10-CM | POA: Diagnosis not present

## 2013-11-08 DIAGNOSIS — I10 Essential (primary) hypertension: Secondary | ICD-10-CM | POA: Diagnosis not present

## 2013-11-08 DIAGNOSIS — E119 Type 2 diabetes mellitus without complications: Secondary | ICD-10-CM | POA: Diagnosis not present

## 2013-11-10 DIAGNOSIS — I10 Essential (primary) hypertension: Secondary | ICD-10-CM | POA: Diagnosis not present

## 2013-11-10 DIAGNOSIS — N184 Chronic kidney disease, stage 4 (severe): Secondary | ICD-10-CM | POA: Diagnosis not present

## 2013-11-10 DIAGNOSIS — M25569 Pain in unspecified knee: Secondary | ICD-10-CM | POA: Diagnosis not present

## 2013-11-14 DIAGNOSIS — M6281 Muscle weakness (generalized): Secondary | ICD-10-CM | POA: Diagnosis not present

## 2013-11-16 DIAGNOSIS — M6281 Muscle weakness (generalized): Secondary | ICD-10-CM | POA: Diagnosis not present

## 2013-11-20 DIAGNOSIS — M6281 Muscle weakness (generalized): Secondary | ICD-10-CM | POA: Diagnosis not present

## 2013-11-21 DIAGNOSIS — H40013 Open angle with borderline findings, low risk, bilateral: Secondary | ICD-10-CM | POA: Diagnosis not present

## 2013-11-21 DIAGNOSIS — H35373 Puckering of macula, bilateral: Secondary | ICD-10-CM | POA: Diagnosis not present

## 2013-11-21 DIAGNOSIS — H3531 Nonexudative age-related macular degeneration: Secondary | ICD-10-CM | POA: Diagnosis not present

## 2013-11-21 DIAGNOSIS — M6281 Muscle weakness (generalized): Secondary | ICD-10-CM | POA: Diagnosis not present

## 2013-11-21 DIAGNOSIS — E119 Type 2 diabetes mellitus without complications: Secondary | ICD-10-CM | POA: Diagnosis not present

## 2013-11-23 DIAGNOSIS — M6281 Muscle weakness (generalized): Secondary | ICD-10-CM | POA: Diagnosis not present

## 2013-11-28 DIAGNOSIS — M6281 Muscle weakness (generalized): Secondary | ICD-10-CM | POA: Diagnosis not present

## 2013-11-30 DIAGNOSIS — M6281 Muscle weakness (generalized): Secondary | ICD-10-CM | POA: Diagnosis not present

## 2013-12-01 DIAGNOSIS — M6281 Muscle weakness (generalized): Secondary | ICD-10-CM | POA: Diagnosis not present

## 2013-12-04 DIAGNOSIS — M6281 Muscle weakness (generalized): Secondary | ICD-10-CM | POA: Diagnosis not present

## 2013-12-07 DIAGNOSIS — M6281 Muscle weakness (generalized): Secondary | ICD-10-CM | POA: Diagnosis not present

## 2013-12-08 DIAGNOSIS — M6281 Muscle weakness (generalized): Secondary | ICD-10-CM | POA: Diagnosis not present

## 2013-12-11 DIAGNOSIS — M6281 Muscle weakness (generalized): Secondary | ICD-10-CM | POA: Diagnosis not present

## 2013-12-12 DIAGNOSIS — M6281 Muscle weakness (generalized): Secondary | ICD-10-CM | POA: Diagnosis not present

## 2013-12-14 DIAGNOSIS — M6281 Muscle weakness (generalized): Secondary | ICD-10-CM | POA: Diagnosis not present

## 2013-12-18 DIAGNOSIS — M6281 Muscle weakness (generalized): Secondary | ICD-10-CM | POA: Diagnosis not present

## 2013-12-21 DIAGNOSIS — Z23 Encounter for immunization: Secondary | ICD-10-CM | POA: Diagnosis not present

## 2013-12-21 DIAGNOSIS — M6281 Muscle weakness (generalized): Secondary | ICD-10-CM | POA: Diagnosis not present

## 2013-12-22 DIAGNOSIS — M6281 Muscle weakness (generalized): Secondary | ICD-10-CM | POA: Diagnosis not present

## 2013-12-25 DIAGNOSIS — M6281 Muscle weakness (generalized): Secondary | ICD-10-CM | POA: Diagnosis not present

## 2013-12-26 DIAGNOSIS — M6281 Muscle weakness (generalized): Secondary | ICD-10-CM | POA: Diagnosis not present

## 2013-12-28 DIAGNOSIS — M6281 Muscle weakness (generalized): Secondary | ICD-10-CM | POA: Diagnosis not present

## 2014-02-08 DIAGNOSIS — E118 Type 2 diabetes mellitus with unspecified complications: Secondary | ICD-10-CM | POA: Diagnosis not present

## 2014-02-08 DIAGNOSIS — I10 Essential (primary) hypertension: Secondary | ICD-10-CM | POA: Diagnosis not present

## 2014-02-14 DIAGNOSIS — E780 Pure hypercholesterolemia: Secondary | ICD-10-CM | POA: Diagnosis not present

## 2014-02-14 DIAGNOSIS — I10 Essential (primary) hypertension: Secondary | ICD-10-CM | POA: Diagnosis not present

## 2014-02-14 DIAGNOSIS — E118 Type 2 diabetes mellitus with unspecified complications: Secondary | ICD-10-CM | POA: Diagnosis not present

## 2014-03-01 DIAGNOSIS — E780 Pure hypercholesterolemia: Secondary | ICD-10-CM | POA: Diagnosis not present

## 2014-03-01 DIAGNOSIS — E118 Type 2 diabetes mellitus with unspecified complications: Secondary | ICD-10-CM | POA: Diagnosis not present

## 2014-03-01 DIAGNOSIS — I10 Essential (primary) hypertension: Secondary | ICD-10-CM | POA: Diagnosis not present

## 2014-05-01 DIAGNOSIS — N183 Chronic kidney disease, stage 3 (moderate): Secondary | ICD-10-CM | POA: Diagnosis not present

## 2014-05-01 DIAGNOSIS — I10 Essential (primary) hypertension: Secondary | ICD-10-CM | POA: Diagnosis not present

## 2014-05-31 DIAGNOSIS — E0865 Diabetes mellitus due to underlying condition with hyperglycemia: Secondary | ICD-10-CM | POA: Diagnosis not present

## 2014-05-31 DIAGNOSIS — I10 Essential (primary) hypertension: Secondary | ICD-10-CM | POA: Diagnosis not present

## 2014-05-31 DIAGNOSIS — E119 Type 2 diabetes mellitus without complications: Secondary | ICD-10-CM | POA: Diagnosis not present

## 2014-06-05 DIAGNOSIS — E780 Pure hypercholesterolemia: Secondary | ICD-10-CM | POA: Diagnosis not present

## 2014-06-05 DIAGNOSIS — E119 Type 2 diabetes mellitus without complications: Secondary | ICD-10-CM | POA: Diagnosis not present

## 2014-06-05 DIAGNOSIS — E669 Obesity, unspecified: Secondary | ICD-10-CM | POA: Diagnosis not present

## 2014-06-05 DIAGNOSIS — I10 Essential (primary) hypertension: Secondary | ICD-10-CM | POA: Diagnosis not present

## 2014-08-07 DIAGNOSIS — M25562 Pain in left knee: Secondary | ICD-10-CM | POA: Diagnosis not present

## 2014-08-07 DIAGNOSIS — M1712 Unilateral primary osteoarthritis, left knee: Secondary | ICD-10-CM | POA: Diagnosis not present

## 2014-08-31 DIAGNOSIS — E119 Type 2 diabetes mellitus without complications: Secondary | ICD-10-CM | POA: Diagnosis not present

## 2014-08-31 DIAGNOSIS — I10 Essential (primary) hypertension: Secondary | ICD-10-CM | POA: Diagnosis not present

## 2014-09-07 DIAGNOSIS — E119 Type 2 diabetes mellitus without complications: Secondary | ICD-10-CM | POA: Diagnosis not present

## 2014-09-07 DIAGNOSIS — I10 Essential (primary) hypertension: Secondary | ICD-10-CM | POA: Diagnosis not present

## 2014-09-07 DIAGNOSIS — E780 Pure hypercholesterolemia: Secondary | ICD-10-CM | POA: Diagnosis not present

## 2014-12-03 DIAGNOSIS — I10 Essential (primary) hypertension: Secondary | ICD-10-CM | POA: Diagnosis not present

## 2014-12-03 DIAGNOSIS — E119 Type 2 diabetes mellitus without complications: Secondary | ICD-10-CM | POA: Diagnosis not present

## 2014-12-07 DIAGNOSIS — I10 Essential (primary) hypertension: Secondary | ICD-10-CM | POA: Diagnosis not present

## 2014-12-07 DIAGNOSIS — E7800 Pure hypercholesterolemia, unspecified: Secondary | ICD-10-CM | POA: Diagnosis not present

## 2014-12-07 DIAGNOSIS — E119 Type 2 diabetes mellitus without complications: Secondary | ICD-10-CM | POA: Diagnosis not present

## 2014-12-07 DIAGNOSIS — Z23 Encounter for immunization: Secondary | ICD-10-CM | POA: Diagnosis not present

## 2015-01-21 DIAGNOSIS — H353120 Nonexudative age-related macular degeneration, left eye, stage unspecified: Secondary | ICD-10-CM | POA: Diagnosis not present

## 2015-01-21 DIAGNOSIS — E119 Type 2 diabetes mellitus without complications: Secondary | ICD-10-CM | POA: Diagnosis not present

## 2015-01-21 DIAGNOSIS — H40013 Open angle with borderline findings, low risk, bilateral: Secondary | ICD-10-CM | POA: Diagnosis not present

## 2015-01-21 DIAGNOSIS — H353110 Nonexudative age-related macular degeneration, right eye, stage unspecified: Secondary | ICD-10-CM | POA: Diagnosis not present

## 2015-04-09 DIAGNOSIS — E119 Type 2 diabetes mellitus without complications: Secondary | ICD-10-CM | POA: Diagnosis not present

## 2015-04-09 DIAGNOSIS — I10 Essential (primary) hypertension: Secondary | ICD-10-CM | POA: Diagnosis not present

## 2016-04-13 DIAGNOSIS — M48062 Spinal stenosis, lumbar region with neurogenic claudication: Secondary | ICD-10-CM | POA: Diagnosis not present

## 2016-04-13 DIAGNOSIS — M545 Low back pain: Secondary | ICD-10-CM | POA: Diagnosis not present

## 2017-03-18 ENCOUNTER — Other Ambulatory Visit: Payer: Self-pay

## 2017-03-18 ENCOUNTER — Emergency Department (HOSPITAL_COMMUNITY)
Admission: EM | Admit: 2017-03-18 | Discharge: 2017-03-18 | Disposition: A | Discharge status: Home / Self Care | Payer: Medicare Other | Attending: Emergency Medicine | Admitting: Emergency Medicine

## 2017-03-18 DIAGNOSIS — R262 Difficulty in walking, not elsewhere classified: Secondary | ICD-10-CM | POA: Insufficient documentation

## 2017-03-18 DIAGNOSIS — Z79899 Other long term (current) drug therapy: Secondary | ICD-10-CM | POA: Insufficient documentation

## 2017-03-18 DIAGNOSIS — M6281 Muscle weakness (generalized): Secondary | ICD-10-CM | POA: Diagnosis present

## 2017-03-18 DIAGNOSIS — Z7982 Long term (current) use of aspirin: Secondary | ICD-10-CM | POA: Diagnosis not present

## 2017-03-18 LAB — CBC WITH DIFFERENTIAL/PLATELET
Basophils Absolute: 0 10*3/uL (ref 0.0–0.1)
Basophils Relative: 0 %
Eosinophils Absolute: 0.1 10*3/uL (ref 0.0–0.7)
Eosinophils Relative: 1 %
HCT: 36.8 % (ref 36.0–46.0)
Hemoglobin: 11.8 g/dL — ABNORMAL LOW (ref 12.0–15.0)
Lymphocytes Relative: 17 %
Lymphs Abs: 0.8 10*3/uL (ref 0.7–4.0)
MCH: 31.9 pg (ref 26.0–34.0)
MCHC: 32.1 g/dL (ref 30.0–36.0)
MCV: 99.5 fL (ref 78.0–100.0)
Monocytes Absolute: 0.4 10*3/uL (ref 0.1–1.0)
Monocytes Relative: 9 %
Neutro Abs: 3.2 10*3/uL (ref 1.7–7.7)
Neutrophils Relative %: 73 %
Platelets: 179 10*3/uL (ref 150–400)
RBC: 3.7 MIL/uL — ABNORMAL LOW (ref 3.87–5.11)
RDW: 13.3 % (ref 11.5–15.5)
WBC: 4.4 10*3/uL (ref 4.0–10.5)

## 2017-03-18 LAB — URINALYSIS, ROUTINE W REFLEX MICROSCOPIC
Bilirubin Urine: NEGATIVE
Glucose, UA: NEGATIVE mg/dL
Hgb urine dipstick: NEGATIVE
Ketones, ur: NEGATIVE mg/dL
Leukocytes, UA: NEGATIVE
Nitrite: NEGATIVE
Protein, ur: NEGATIVE mg/dL
Specific Gravity, Urine: 1.01 (ref 1.005–1.030)
pH: 6 (ref 5.0–8.0)

## 2017-03-18 LAB — BASIC METABOLIC PANEL
Anion gap: 6 (ref 5–15)
BUN: 33 mg/dL — ABNORMAL HIGH (ref 6–20)
CO2: 24 mmol/L (ref 22–32)
Calcium: 8.8 mg/dL — ABNORMAL LOW (ref 8.9–10.3)
Chloride: 107 mmol/L (ref 101–111)
Creatinine, Ser: 1.2 mg/dL — ABNORMAL HIGH (ref 0.44–1.00)
GFR calc Af Amer: 45 mL/min — ABNORMAL LOW (ref >60)
GFR calc non Af Amer: 39 mL/min — ABNORMAL LOW (ref >60)
Glucose, Bld: 121 mg/dL — ABNORMAL HIGH (ref 65–99)
Potassium: 4.5 mmol/L (ref 3.5–5.1)
Sodium: 137 mmol/L (ref 135–145)

## 2017-03-18 NOTE — ED Notes (Signed)
[facility] Expected date:  Expected time:  Means of arrival:  Comments: EMS- 84yo  F, knee/back pain

## 2017-03-18 NOTE — Discharge Planning (Signed)
Stepheny Canal J. Lucretia RoersWood, RN, BSN, [place] [phone] EDSWSpoke with pt at bedside regarding discharge planning for Home Health Services. Offered pt list of home health agencies to choose from.  Pt chose [facility] to render services. [facility]Karen  [facility]. [facility]   DME needs identified at this time include 3n1.  [facility] will deliver 3n1 to pt prior to discharge home today.

## 2017-03-18 NOTE — ED Triage Notes (Signed)
Per EMS, patient comes from home. C/o increased back pain and bilateral knees/legs. 2 days ago knees gave out and pt fell. Family wanted her to get checked out. Alert and oriented.

## 2017-03-18 NOTE — ED Notes (Signed)
[facility] called for transport.  

## 2017-03-18 NOTE — Discharge Planning (Signed)
Pt made aware that [facility]

## 2017-03-19 NOTE — ED Provider Notes (Signed)
[facility]-EMERGENCY DEPT Provider Note   CSN: [id number] Arrival date & time: 03/18/17  1034     History   Chief Complaint Chief Complaint  Patient presents with  . Fall  . Weakness    HPI Suzanne Thomas is a 84 y.o. female.  HPI   89yF presenting for daughter for evaluation of generalized weakness. Pt reports two days ago she was going up steps when "my knees just went out." Denies pain. Didn't injure herself. Says she had no warning. She just went down. She had no LOC. Did not feel dizzy. Daughter is concerned   No past medical history on file.  There are no active problems to display for this patient.    OB History    No data available       Home Medications    Prior to Admission medications   Medication Sig Start Date End Date Taking? Authorizing Provider  amLODipine-valsartan (EXFORGE) 10-160 MG tablet Take 1 tablet by mouth daily.   Yes [provider]  aspirin EC 81 MG tablet Take 81 mg by mouth daily.   Yes [provider]  atorvastatin (LIPITOR) 40 MG tablet Take 40 mg by mouth at bedtime.    Yes [provider]  bisoprolol-hydrochlorothiazide (ZIAC) 10-6.25 MG tablet Take 1 tablet by mouth 2 (two) times daily.   Yes [provider]  Insulin Glargine (BASAGLAR KWIKPEN) 100 UNIT/ML SOPN Inject 27 Units into the skin at bedtime.  03/15/17  Yes [provider]  Multiple Vitamins-Minerals (PRESERVISION AREDS PO) Take 1 tablet by mouth 2 (two) times daily.   Yes [provider]  pioglitazone (ACTOS) 15 MG tablet Take 15 mg by mouth daily.   Yes [provider]  saxagliptin HCl (ONGLYZA) 2.5 MG TABS tablet Take 2.5 mg by mouth daily.   Yes [provider]    Family History No family history on file.  Social History Social History   Tobacco Use  . Smoking status: Not on file  Substance Use Topics  . Alcohol use: Not on file  . Drug use: Not on file     Allergies    Codeine   Review of Systems Review of Systems  All systems reviewed and negative, other than as noted in HPI.  Physical Exam Updated Vital Signs BP (!) 149/58 (BP Location: Left Arm)   Pulse (!) 58   Temp 97.8 F (36.6 C) (Oral)   Resp 18   Ht 5\' 4"  (1.626 m)   Wt 93.9 kg (207 lb)   SpO2 97%   BMI 35.53 kg/m   Physical Exam  Constitutional: She appears well-developed and well-nourished. No distress.  HENT:  Head: Normocephalic and atraumatic.  Eyes: Conjunctivae are normal. Right eye exhibits no discharge. Left eye exhibits no discharge.  Neck: Neck supple.  Cardiovascular: Normal rate, regular rhythm and normal heart sounds. Exam reveals no gallop and no friction rub.  No murmur heard. Pulmonary/Chest: Effort normal and breath sounds normal. No respiratory distress.  Abdominal: Soft. She exhibits no distension. There is no tenderness.  Musculoskeletal: She exhibits no edema or tenderness.  No bony tenderness the knees or apparent pain with range of motion.  Neurological: She is alert. She displays normal reflexes. No cranial nerve deficit. She exhibits normal muscle tone.  Skin: Skin is warm and dry.  Psychiatric: She has a normal mood and affect. Her behavior is normal. Thought content normal.  Nursing note and vitals reviewed.    ED  Treatments / Results  Labs (all labs ordered are listed, but only abnormal results are displayed) Labs Reviewed  CBC WITH DIFFERENTIAL/PLATELET - Abnormal; Notable for the following components:      Result Value   RBC 3.70 (*)    Hemoglobin 11.8 (*)    All other components within normal limits  BASIC METABOLIC PANEL - Abnormal; Notable for the following components:   Glucose, Bld 121 (*)    BUN 33 (*)    Creatinine, Ser 1.20 (*)    Calcium 8.8 (*)    GFR calc non Af Amer 39 (*)    GFR calc Af Amer 45 (*)    All other components within normal limits  URINALYSIS, ROUTINE W REFLEX MICROSCOPIC    EKG  EKG  Interpretation None       Radiology No results found.  Procedures Procedures (including critical care time)  Medications Ordered in ED Medications - No data to display   Initial Impression / Assessment and Plan / ED Course  I have reviewed the triage vital signs and the nursing notes.  Pertinent labs & imaging results that were available during my care of the patient were reviewed by me and considered in my medical decision making (see chart for details).     84 year old female presenting more for social concerns/amatory dysfunction.  Exam is reassuring.  Strength is actually normally swelling in bed.  Social work/case management was involved to help assist with resources and equipment.  Final Clinical Impressions(s) / ED Diagnoses   Final diagnoses:  Ambulatory dysfunction    ED Discharge Orders        Ordered    Home Health     03/18/17 1139    Face-to-face encounter (required for Medicare/Medicaid patients)    Comments:  I Raeford Razor certify that this patient is under my care and that I, or a nurse practitioner or physician's assistant working with me, had a face-to-face encounter that meets the physician face-to-face encounter requirements with this patient on 03/18/2017. The encounter with the patient was in whole, or in part for the following medical condition(s) which is the primary reason for home health care (List medical condition): ambulatory dysfunction   03/18/17 1139    Home Health     03/18/17 1238    Face-to-face encounter (required for Medicare/Medicaid patients)    Comments:  I Raeford Razor certify that this patient is under my care and that I, or a nurse practitioner or physician's assistant working with me, had a face-to-face encounter that meets the physician face-to-face encounter requirements with this patient on 03/18/2017. The encounter with the patient was in whole, or in part for the following medical condition(s) which is the primary reason for  home health care (List medical condition): ambulatory dysfunction   03/18/17 1238       Raeford Razor, MD 03/19/17 1438

## 2018-03-12 DEATH — deceased
